# Patient Record
Sex: Female | Born: 1995 | Race: Black or African American | Hispanic: No | Marital: Single | Smoking: Never smoker
Health system: Southern US, Community
[De-identification: ages and names within clinical notes are randomized; demographics above are authoritative.]

---

## 2019-05-31 ENCOUNTER — Ambulatory Visit: Payer: Self-pay | Attending: Family

## 2019-05-31 DIAGNOSIS — Z23 Encounter for immunization: Secondary | ICD-10-CM

## 2019-05-31 NOTE — Progress Notes (Signed)
   Covid-19 Vaccination Clinic  Name:  Shirley Hogan    MRN: [id number] DOB: Jan 18, 1996  05/31/2019  [name] was observed post Covid-19 immunization for 15 minutes without incident. She was provided with Vaccine Information Sheet and instruction to access the V-Safe system.   [name] was instructed to call 911 with any severe reactions post vaccine: Marland Kitchen Difficulty breathing  . Swelling of face and throat  . A fast heartbeat  . A bad rash all over body  . Dizziness and weakness   Immunizations Administered    Name Date Dose VIS Date Route   Moderna COVID-19 Vaccine 05/31/2019 11:18 AM 0.5 mL 02/06/2019 Intramuscular   Manufacturer: Moderna   Lot: [id number]   NDC: 84128-208-13

## 2019-07-03 ENCOUNTER — Ambulatory Visit: Payer: Self-pay | Attending: Family

## 2019-07-03 DIAGNOSIS — Z23 Encounter for immunization: Secondary | ICD-10-CM

## 2019-07-03 NOTE — Progress Notes (Signed)
   Covid-19 Vaccination Clinic  Name:  Abel Ra    MRN: [id number] DOB: 1995/09/15  07/03/2019  Ms. Maday was observed post Covid-19 immunization for 15 minutes without incident. She was provided with Vaccine Information Sheet and instruction to access the V-Safe system.   Ms. Facemire was instructed to call 911 with any severe reactions post vaccine: Marland Kitchen Difficulty breathing  . Swelling of face and throat  . A fast heartbeat  . A bad rash all over body  . Dizziness and weakness   Immunizations Administered    Name Date Dose VIS Date Route   Moderna COVID-19 Vaccine 07/03/2019 10:24 AM 0.5 mL 02/2019 Intramuscular   Manufacturer: Moderna   Lot: [id number]   NDC: 97989-211-94

## 2020-05-07 ENCOUNTER — Emergency Department (HOSPITAL_COMMUNITY)
Admission: EM | Admit: 2020-05-07 | Discharge: 2020-05-08 | Disposition: A | Discharge status: Home / Self Care | Payer: No Typology Code available for payment source | Attending: Emergency Medicine | Admitting: Emergency Medicine

## 2020-05-07 ENCOUNTER — Other Ambulatory Visit: Payer: Self-pay

## 2020-05-07 ENCOUNTER — Encounter (HOSPITAL_COMMUNITY): Payer: Self-pay

## 2020-05-07 DIAGNOSIS — R1031 Right lower quadrant pain: Secondary | ICD-10-CM | POA: Insufficient documentation

## 2020-05-07 DIAGNOSIS — K5900 Constipation, unspecified: Secondary | ICD-10-CM

## 2020-05-07 LAB — CBC
HCT: 40.2 % (ref 36.0–46.0)
Hemoglobin: 12.7 g/dL (ref 12.0–15.0)
MCH: 28.1 pg (ref 26.0–34.0)
MCHC: 31.6 g/dL (ref 30.0–36.0)
MCV: 88.9 fL (ref 80.0–100.0)
Platelets: 242 10*3/uL (ref 150–400)
RBC: 4.52 MIL/uL (ref 3.87–5.11)
RDW: 13.9 % (ref 11.5–15.5)
WBC: 5.2 10*3/uL (ref 4.0–10.5)
nRBC: 0 % (ref 0.0–0.2)

## 2020-05-07 LAB — COMPREHENSIVE METABOLIC PANEL
ALT: 14 U/L (ref 0–44)
AST: 20 U/L (ref 15–41)
Albumin: 4.3 g/dL (ref 3.5–5.0)
Alkaline Phosphatase: 34 U/L — ABNORMAL LOW (ref 38–126)
Anion gap: 8 (ref 5–15)
BUN: 11 mg/dL (ref 6–20)
CO2: 24 mmol/L (ref 22–32)
Calcium: 9.7 mg/dL (ref 8.9–10.3)
Chloride: 105 mmol/L (ref 98–111)
Creatinine, Ser: 0.77 mg/dL (ref 0.44–1.00)
GFR, Estimated: >60 mL/min (ref >60)
Glucose, Bld: 87 mg/dL (ref 70–99)
Potassium: 4.4 mmol/L (ref 3.5–5.1)
Sodium: 137 mmol/L (ref 135–145)
Total Bilirubin: 0.6 mg/dL (ref 0.3–1.2)
Total Protein: 7.4 g/dL (ref 6.5–8.1)

## 2020-05-07 LAB — URINALYSIS, ROUTINE W REFLEX MICROSCOPIC
Bilirubin Urine: NEGATIVE
Glucose, UA: NEGATIVE mg/dL
Hgb urine dipstick: NEGATIVE
Ketones, ur: NEGATIVE mg/dL
Leukocytes,Ua: NEGATIVE
Nitrite: NEGATIVE
Protein, ur: NEGATIVE mg/dL
Specific Gravity, Urine: 1.008 (ref 1.005–1.030)
pH: 7 (ref 5.0–8.0)

## 2020-05-07 LAB — I-STAT BETA HCG BLOOD, ED (MC, WL, AP ONLY): I-stat hCG, quantitative: <5 m[IU]/mL (ref <5)

## 2020-05-07 LAB — LIPASE, BLOOD: Lipase: 53 U/L — ABNORMAL HIGH (ref 11–51)

## 2020-05-07 NOTE — ED Triage Notes (Addendum)
Pt reports RLQ abdominal pain off and on since Aug/ Sept. Sts tonight pain was sharp and a "burning" sensation. Pt sts she was told she has a cyst on the right side.

## 2020-05-07 NOTE — ED Provider Notes (Signed)
[facility]-EMERGENCY DEPT [facility] Emergency Department Provider Note MRN:  [id number]  Arrival date & time: 05/08/20     Chief Complaint   Abdominal Pain   History of Present Illness   Shirley Hogan is a 25 y.o. year-old female with no pertinent past medical history presenting to the ED with chief complaint of abdominal pain.  Location: Right lower quadrant Duration: 2 or 3 hours Onset: Gradual Timing: Constant Description: Burning Severity: Moderate to severe Exacerbating/Alleviating Factors: Worse with moving around Associated Symptoms: Single episode of diarrhea this evening Pertinent Negatives: Denies fever, no change in appetite, no nausea vomiting, no vaginal bleeding or discharge   Review of Systems  A complete 10 system review of systems was obtained and all systems are negative except as noted in the HPI and PMH.   Patient's Health History   History reviewed. No pertinent past medical history.  History reviewed. No pertinent surgical history.  No family history on file.  Social History   Socioeconomic History  . Marital status: Single    Spouse name: Not on file  . Number of children: Not on file  . Years of education: Not on file  . Highest education level: Not on file  Occupational History  . Not on file  Tobacco Use  . Smoking status: Never Smoker  . Smokeless tobacco: Never Used  Substance and Sexual Activity  . Alcohol use: Not Currently  . Drug use: Never  . Sexual activity: Not on file  Other Topics Concern  . Not on file  Social History Narrative  . Not on file   Social Determinants of Health   Financial Resource Strain: Not on file  Food Insecurity: Not on file  Transportation Needs: Not on file  Physical Activity: Not on file  Stress: Not on file  Social Connections: Not on file  Intimate Partner Violence: Not on file     Physical Exam   Vitals:   05/08/20 0030 05/08/20 0100  BP: 119/76 128/67  Pulse: (!) 53 (!) 56  Resp: 19  14  Temp:    SpO2: 100% 100%    CONSTITUTIONAL: Well-appearing, NAD NEURO:  Alert and oriented x 3, no focal deficits EYES:  eyes equal and reactive ENT/NECK:  no LAD, no JVD CARDIO: Regular rate, well-perfused, normal S1 and S2 PULM:  CTAB no wheezing or rhonchi GI/GU:  normal bowel sounds, non-distended, moderate focal right lower quadrant tenderness to palpation MSK/SPINE:  No gross deformities, no edema SKIN:  no rash, atraumatic PSYCH:  Appropriate speech and behavior  *Additional and/or pertinent findings included in MDM below  Diagnostic and Interventional Summary    EKG Interpretation  Date/Time:    Ventricular Rate:    PR Interval:    QRS Duration:   QT Interval:    QTC Calculation:   R Axis:     Text Interpretation:        Labs Reviewed  LIPASE, BLOOD - Abnormal; Notable for the following components:      Result Value   Lipase 53 (*)    All other components within normal limits  COMPREHENSIVE METABOLIC PANEL - Abnormal; Notable for the following components:   Alkaline Phosphatase 34 (*)    All other components within normal limits  URINALYSIS, ROUTINE W REFLEX MICROSCOPIC - Abnormal; Notable for the following components:   Color, Urine STRAW (*)    All other components within normal limits  CBC  I-STAT BETA HCG BLOOD, ED ([facility], [facility], [facility] ONLY)    CT  ABDOMEN PELVIS W CONTRAST  Final Result      Medications  iohexol (OMNIPAQUE) 300 MG/ML solution 100 mL (100 mLs Intravenous Contrast Given 05/08/20 0127)     Procedures  /  Critical Care Procedures  ED Course and Medical Decision Making  I have reviewed the triage vital signs, the nursing notes, and pertinent available records from the EMR.  Listed above are laboratory and imaging tests that I personally ordered, reviewed, and interpreted and then considered in my medical decision making (see below for details).  Symptoms may be related to ovarian cyst given patient's history of such but she explains that  the pain is different, more severe, lasting longer.  She is focally tender in the right lower quadrant at McBurney's point, difficult to exclude appendicitis without CT.  CT pending.     CT without appendicitis, evidence of right colonic constipation likely patient's source of pain.  With no vaginal bleeding or discharge highly doubt PID, pelvic exam deferred.  Appropriate for discharge.  Elmer Sow. Pilar Plate, MD [facility] Emergency Medicine [facility] [email or web address]  Final Clinical Impressions(s) / ED Diagnoses     ICD-10-CM   1. RLQ abdominal pain  R10.31   2. Constipation, unspecified constipation type  K59.00     ED Discharge Orders    None       Discharge Instructions Discussed with and Provided to Patient:     Discharge Instructions     You were evaluated in the Emergency Department and after careful evaluation, we did not find any emergent condition requiring admission or further testing in the hospital.  Your exam/testing today is overall reassuring.  Symptoms seem to be due to constipation.  Please use over-the-counter Metamucil and MiraLAX as we discussed  Please return to the Emergency Department if you experience any worsening of your condition.   Thank you for allowing [place] to be a part of your care.       Sabas Sous, MD 05/08/20 [phone]

## 2020-05-08 ENCOUNTER — Encounter (HOSPITAL_COMMUNITY): Payer: Self-pay

## 2020-05-08 ENCOUNTER — Emergency Department (HOSPITAL_COMMUNITY): Payer: No Typology Code available for payment source

## 2020-05-08 MED ORDER — IOHEXOL 300 MG/ML  SOLN
100.0000 mL | Freq: Once | INTRAMUSCULAR | Status: AC | PRN
  Administered 2020-05-08: 100 mL via INTRAVENOUS

## 2020-05-08 NOTE — Discharge Instructions (Addendum)
You were evaluated in the Emergency Department and after careful evaluation, we did not find any emergent condition requiring admission or further testing in the hospital.  Your exam/testing today is overall reassuring.  Symptoms seem to be due to constipation.  Please use over-the-counter Metamucil and MiraLAX as we discussed  Please return to the Emergency Department if you experience any worsening of your condition.   Thank you for allowing [place] to be a part of your care.

## 2022-09-25 IMAGING — CT CT ABD-PELV W/ CM
2 of 4 series · 16 of 46 positions shown, 18 images · IV contrast (OMNIPAQUE 300)
Comparison: None.

CLINICAL DATA: Right lower quadrant pain

EXAM:
CT ABDOMEN AND PELVIS WITH CONTRAST
TECHNIQUE: Multidetector CT imaging of the abdomen and pelvis was performed
using the standard protocol following bolus administration of
intravenous contrast.
CONTRAST:  100mL OMNIPAQUE IOHEXOL 300 MG/ML  SOLN

[Series 2: axial st · axial · 0.68mm/px · z∈[+1246,+1616]mm · 13 of 82 slices shown, 15 images]
[im 4/82  soft-tissue]
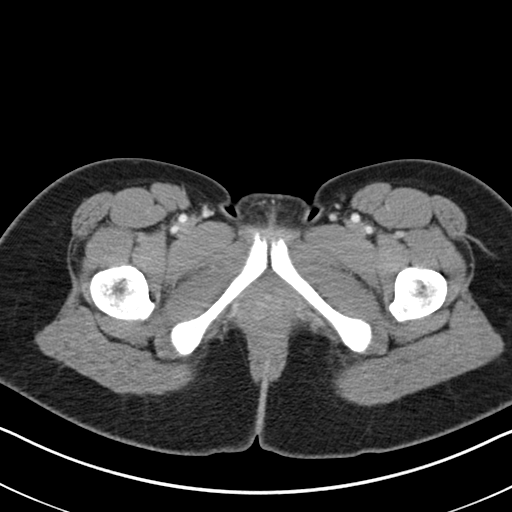
[im 4/82  bone]
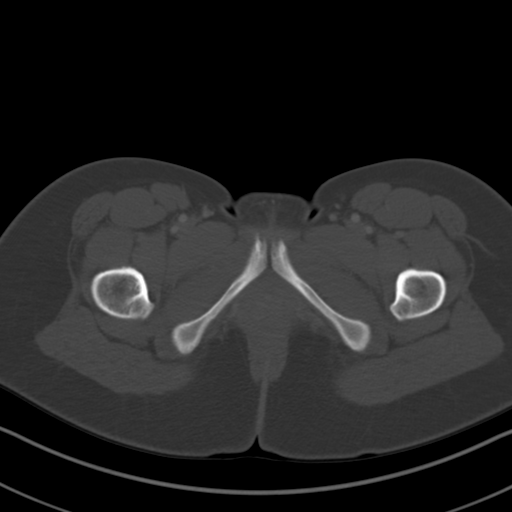
[im 12/82  soft-tissue]
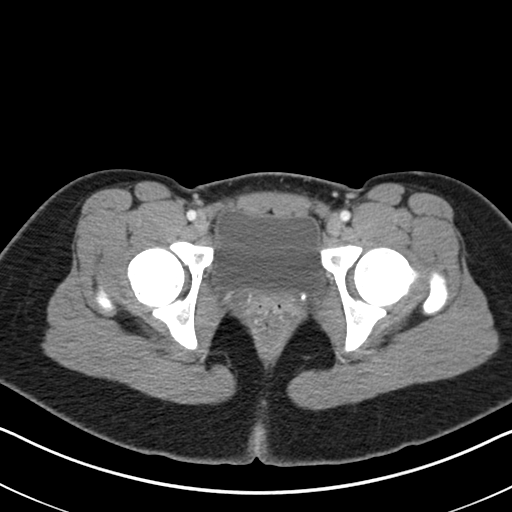
[im 19/82  soft-tissue]
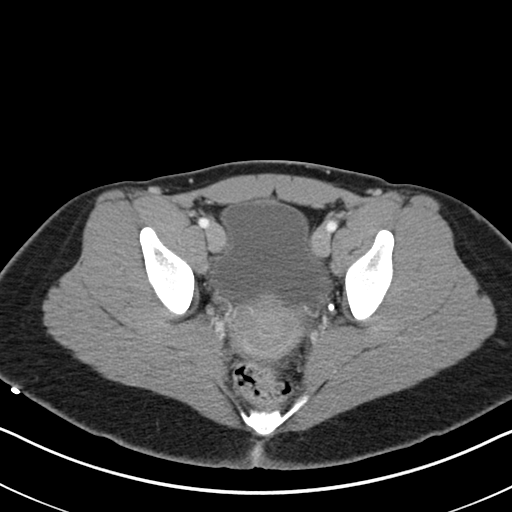
[im 23/82  soft-tissue]
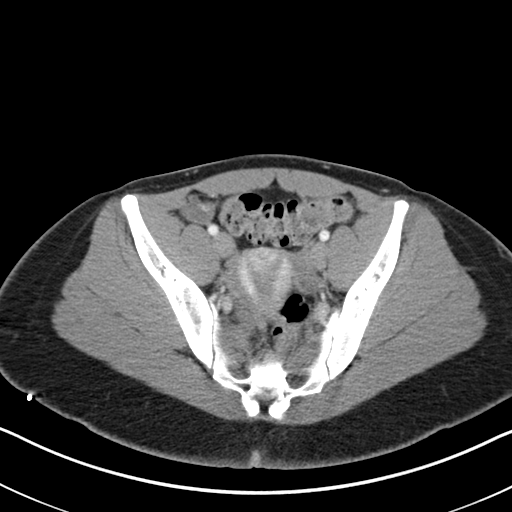
[im 30/82  soft-tissue]
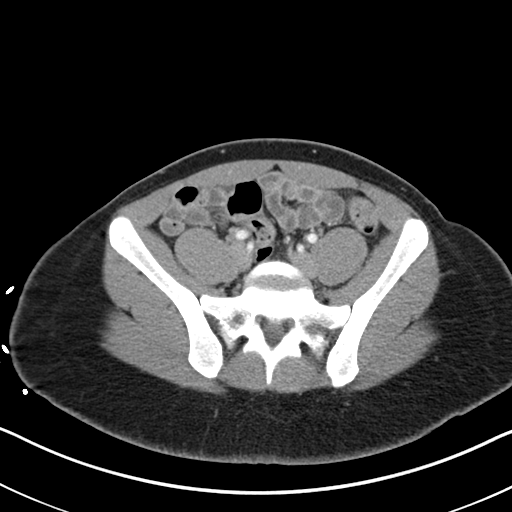
[im 34/82  soft-tissue]
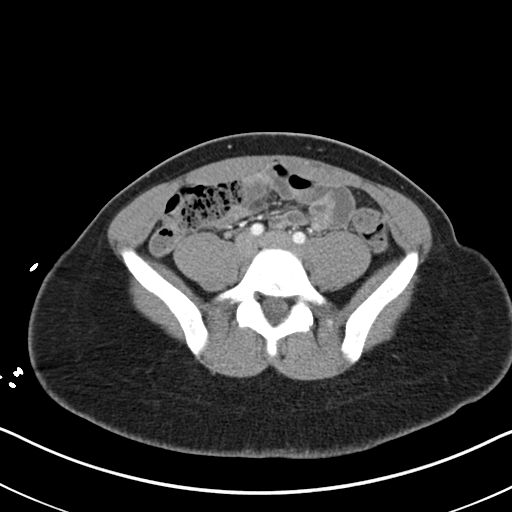
[im 41/82  soft-tissue]
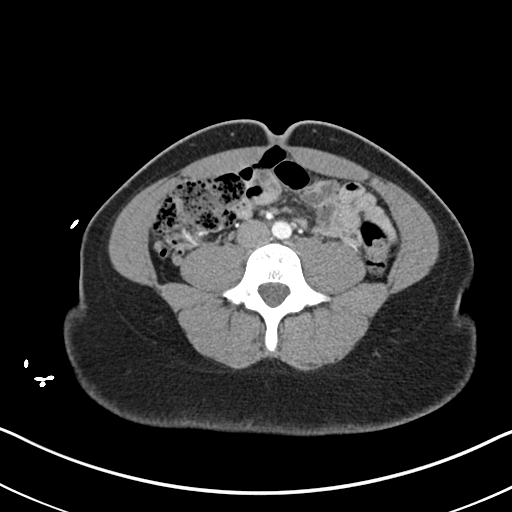
[im 48/82  soft-tissue]
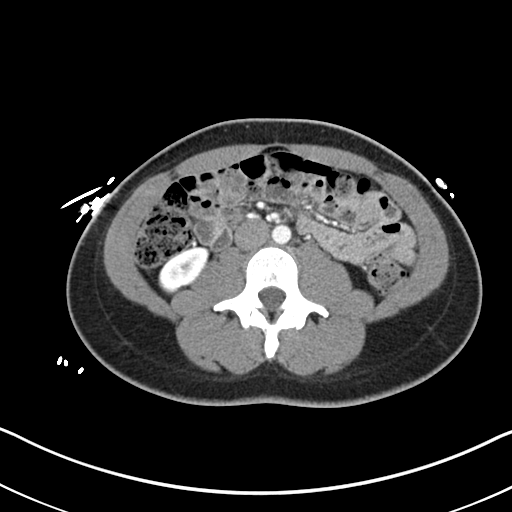
[im 52/82  soft-tissue]
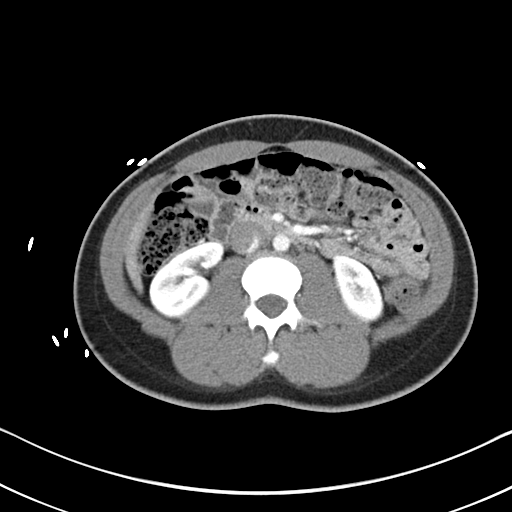
[im 52/82  bone]
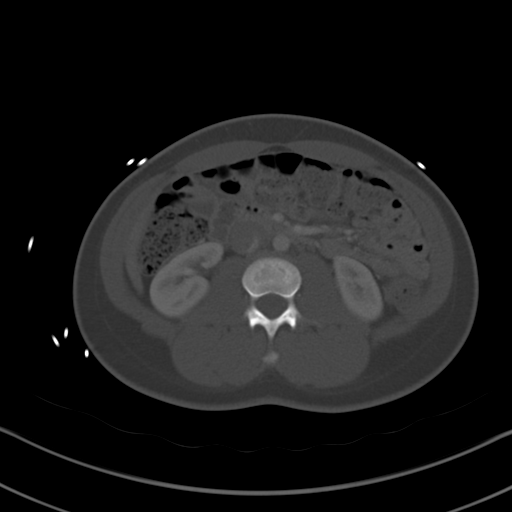
[im 59/82  soft-tissue]
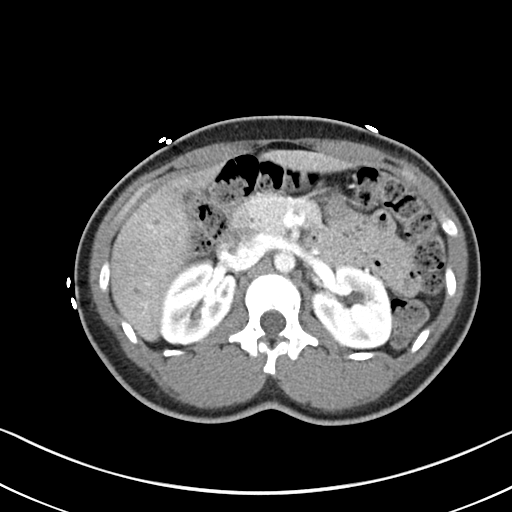
[im 63/82  soft-tissue]
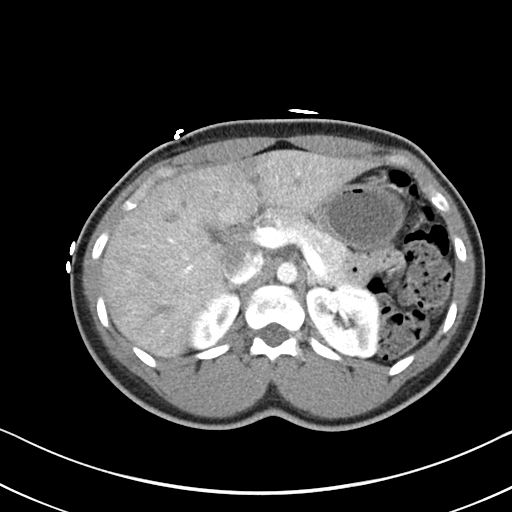
[im 70/82  soft-tissue]
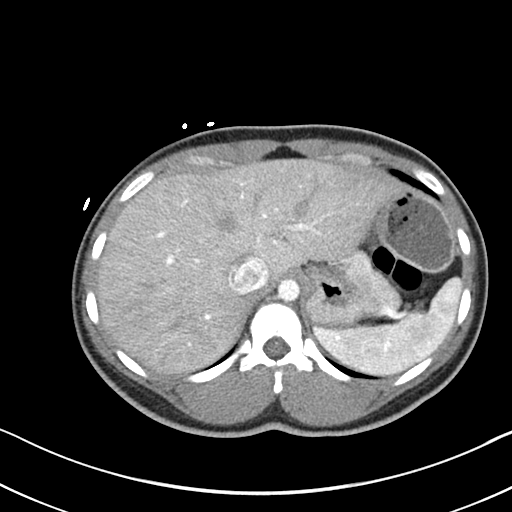
[im 78/82  soft-tissue]
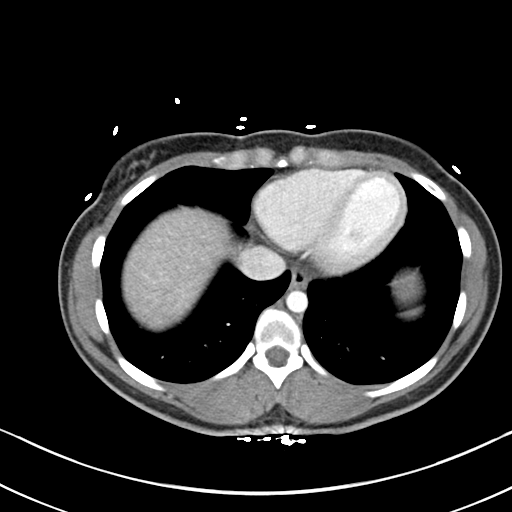

[Series 4: coronal st · coronal · 0.67mm/px · 3 of 117 slices shown]
[im 39/117  soft-tissue]
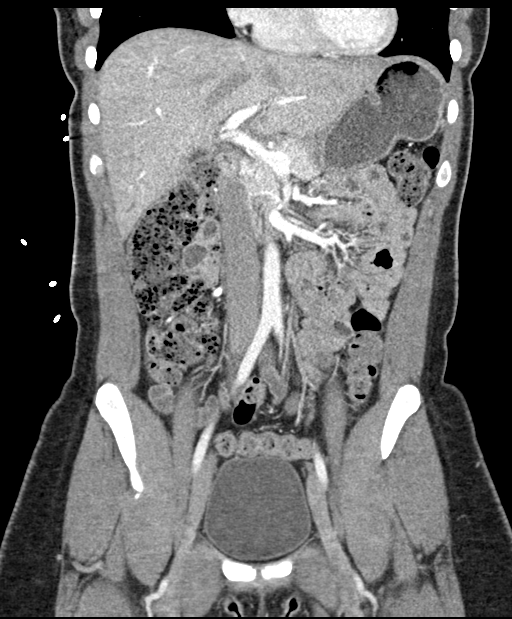
[im 52/117  soft-tissue]
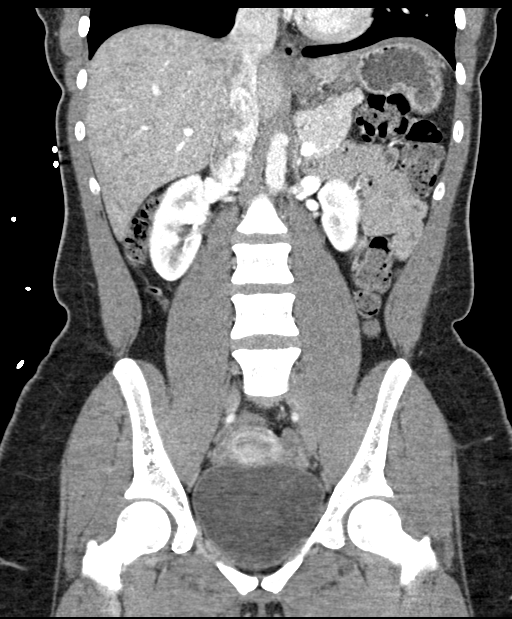
[im 65/117  soft-tissue]
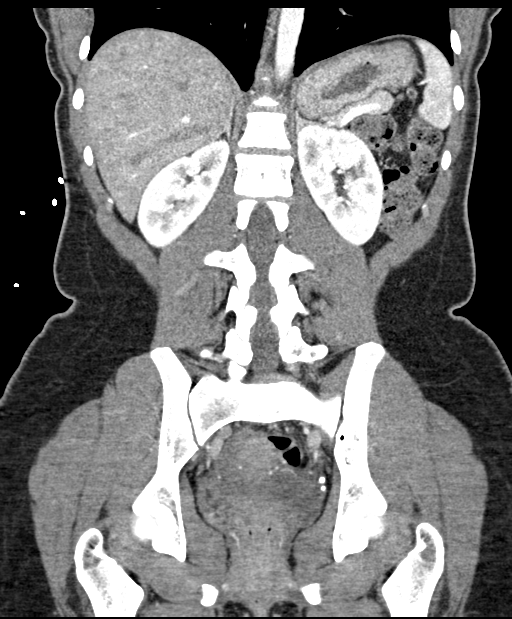

[16 of 46 positions shown; findings below may reference images not displayed]

FINDINGS: Lower chest: The visualized heart size within normal limits. No
pericardial fluid/thickening.

No hiatal hernia.

The visualized portions of the lungs are clear.

Hepatobiliary: The liver is normal in density without focal
abnormality.The main portal vein is patent. No evidence of calcified
gallstones, gallbladder wall thickening or biliary dilatation.

Pancreas: Unremarkable. No pancreatic ductal dilatation or
surrounding inflammatory changes.

Spleen: Normal in size without focal abnormality.

Adrenals/Urinary Tract: Both adrenal glands appear normal. The
kidneys and collecting system appear normal without evidence of
urinary tract calculus or hydronephrosis. Bladder is unremarkable.

Stomach/Bowel: The stomach, small bowel, and colon are normal in
appearance. Moderate amount of right colonic stool is present. No
inflammatory changes, wall thickening, or obstructive findings.The
appendix is normal.

Vascular/Lymphatic: There are no enlarged mesenteric,
retroperitoneal, or pelvic lymph nodes. No significant vascular
findings are present.

Reproductive: The uterus and adnexa are unremarkable. Small amount
of fluid seen within the endometrial canal.

Other: No evidence of abdominal wall mass or hernia.

Musculoskeletal: No acute or significant osseous findings.
IMPRESSION: Moderate amount of right colonic stool.  Normal appearing appendix.

No other acute intra-abdominal or pelvic pathology to explain the
patient's symptoms.
